# Patient Record
Sex: Male | Born: 2000 | Race: White | Hispanic: No | Marital: Single | Smoking: Former smoker
Health system: Southern US, Community
[De-identification: ages and names within clinical notes are randomized; demographics above are authoritative.]

## PROBLEM LIST (undated history)

## (undated) HISTORY — PX: ELBOW SURGERY: SHX618

## (undated) HISTORY — PX: NOSE SURGERY: SHX723

---
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
## 2020-04-16 ENCOUNTER — Other Ambulatory Visit: Payer: Medicaid Other

## 2020-04-16 DIAGNOSIS — Z20822 Contact with and (suspected) exposure to covid-19: Secondary | ICD-10-CM

## 2020-04-17 LAB — SARS-COV-2, NAA 2 DAY TAT

## 2020-04-17 LAB — NOVEL CORONAVIRUS, NAA: SARS-CoV-2, NAA: DETECTED — AB
